# Patient Record
Sex: Male | Born: 1970 | Race: White | Hispanic: No | Marital: Married | Smoking: Current every day smoker
Health system: Southern US, Community
[De-identification: ages and names within clinical notes are randomized; demographics above are authoritative.]

## PROBLEM LIST (undated history)

## (undated) DIAGNOSIS — K219 Gastro-esophageal reflux disease without esophagitis: Secondary | ICD-10-CM

## (undated) HISTORY — DX: Gastro-esophageal reflux disease without esophagitis: K21.9

## (undated) HISTORY — PX: TONSILLECTOMY: SUR1361

---

## 1999-04-13 ENCOUNTER — Encounter: Payer: Self-pay | Admitting: *Deleted

## 1999-04-13 ENCOUNTER — Emergency Department (HOSPITAL_COMMUNITY): Admission: EM | Admit: 1999-04-13 | Discharge: 1999-04-13 | Payer: Self-pay | Admitting: Emergency Medicine

## 1999-04-25 ENCOUNTER — Emergency Department (HOSPITAL_COMMUNITY): Admission: EM | Admit: 1999-04-25 | Discharge: 1999-04-25 | Payer: Self-pay | Admitting: Emergency Medicine

## 1999-04-25 ENCOUNTER — Encounter: Payer: Self-pay | Admitting: Emergency Medicine

## 1999-12-03 ENCOUNTER — Emergency Department (HOSPITAL_COMMUNITY): Admission: EM | Admit: 1999-12-03 | Discharge: 1999-12-03 | Payer: Self-pay | Admitting: Emergency Medicine

## 2000-09-26 ENCOUNTER — Emergency Department (HOSPITAL_COMMUNITY): Admission: EM | Admit: 2000-09-26 | Discharge: 2000-09-27 | Payer: Self-pay | Admitting: Emergency Medicine

## 2000-11-09 ENCOUNTER — Other Ambulatory Visit: Admission: RE | Admit: 2000-11-09 | Discharge: 2000-11-09 | Payer: Self-pay | Admitting: Otolaryngology

## 2004-07-21 ENCOUNTER — Encounter: Admission: RE | Admit: 2004-07-21 | Discharge: 2004-07-21 | Payer: Self-pay | Admitting: Otolaryngology

## 2005-06-03 ENCOUNTER — Emergency Department (HOSPITAL_COMMUNITY): Admission: EM | Admit: 2005-06-03 | Discharge: 2005-06-03 | Payer: Self-pay | Admitting: Emergency Medicine

## 2013-04-29 ENCOUNTER — Encounter: Payer: Self-pay | Admitting: Family Medicine

## 2013-04-29 ENCOUNTER — Encounter (INDEPENDENT_AMBULATORY_CARE_PROVIDER_SITE_OTHER): Payer: Self-pay

## 2013-04-29 ENCOUNTER — Ambulatory Visit (INDEPENDENT_AMBULATORY_CARE_PROVIDER_SITE_OTHER): Payer: Managed Care, Other (non HMO)

## 2013-04-29 ENCOUNTER — Ambulatory Visit (INDEPENDENT_AMBULATORY_CARE_PROVIDER_SITE_OTHER): Payer: Managed Care, Other (non HMO) | Admitting: Family Medicine

## 2013-04-29 VITALS — BP 105/67 | HR 84 | Temp 99.0°F

## 2013-04-29 DIAGNOSIS — R509 Fever, unspecified: Secondary | ICD-10-CM

## 2013-04-29 DIAGNOSIS — R059 Cough, unspecified: Secondary | ICD-10-CM

## 2013-04-29 DIAGNOSIS — R05 Cough: Secondary | ICD-10-CM

## 2013-04-29 DIAGNOSIS — J329 Chronic sinusitis, unspecified: Secondary | ICD-10-CM

## 2013-04-29 DIAGNOSIS — R52 Pain, unspecified: Secondary | ICD-10-CM

## 2013-04-29 LAB — POCT INFLUENZA A/B
Influenza A, POC: NEGATIVE
Influenza B, POC: NEGATIVE

## 2013-04-29 MED ORDER — BENZONATATE 100 MG PO CAPS: ORAL_CAPSULE | ORAL | Status: DC

## 2013-04-29 MED ORDER — LEVOFLOXACIN 500 MG PO TABS: 500.0000 mg | ORAL_TABLET | Freq: Every day | ORAL | Status: DC

## 2013-04-29 MED ORDER — METHYLPREDNISOLONE (PAK) 4 MG PO TABS: ORAL_TABLET | ORAL | Status: DC

## 2013-04-29 NOTE — Progress Notes (Signed)
   Subjective:    Patient ID: Manuel Benitez, male    DOB: 08-21-1970, 43 y.o.   MRN: [id number]  HPI This 43 y.o. male presents for evaluation of cough, fever, sinus pressure, and fatigue.   Review of Systems C/o cough, fever, and fatigue. No chest pain, SOB, HA, dizziness, vision change, N/V, diarrhea, constipation, dysuria, urinary urgency or frequency, myalgias, arthralgias or rash.     Objective:   Physical Exam Vital signs noted  Well developed well nourished male.  HEENT - Head atraumatic Normocephalic                Eyes - PERRLA, Conjuctiva - clear Sclera- Clear EOMI                Ears - EAC's Wnl TM's Wnl Gross Hearing WNL                Nose - Nares patent                 Throat - oropharanx wnl Respiratory - Lungs CTA bilateral Cardiac - RRR S1 and S2 without murmur GI - Abdomen soft Nontender and bowel sounds active x 4 Extremities - No edema. Neuro - Grossly intact.  CXR - No infiltrates  Results for orders placed in visit on 04/29/13  POCT INFLUENZA A/B      Result Value Range   Influenza A, POC Negative     Influenza B, POC Negative        Assessment & Plan:  Sinus infection - Plan: levofloxacin (LEVAQUIN) 500 MG tablet, benzonatate (TESSALON PERLES) 100 MG capsule, methylPREDNIsolone (MEDROL DOSPACK) 4 MG tablet  Body aches - Plan: POCT Influenza A/B, benzonatate (TESSALON PERLES) 100 MG capsule  Fever - Plan: POCT Influenza A/B, DG Chest 2 View, levofloxacin (LEVAQUIN) 500 MG tablet  Cough - Plan: POCT Influenza A/B, DG Chest 2 View, benzonatate (TESSALON PERLES) 100 MG capsule  Deatra CanterWilliam J Elliot Meldrum FNP

## 2013-04-29 NOTE — Patient Instructions (Signed)

## 2014-02-03 ENCOUNTER — Ambulatory Visit (INDEPENDENT_AMBULATORY_CARE_PROVIDER_SITE_OTHER): Payer: Managed Care, Other (non HMO) | Admitting: Nurse Practitioner

## 2014-02-03 ENCOUNTER — Encounter: Payer: Self-pay | Admitting: Nurse Practitioner

## 2014-02-03 VITALS — BP 126/76 | HR 78 | Temp 96.7°F | Ht 72.0 in | Wt 218.0 lb

## 2014-02-03 DIAGNOSIS — J40 Bronchitis, not specified as acute or chronic: Secondary | ICD-10-CM

## 2014-02-03 DIAGNOSIS — J019 Acute sinusitis, unspecified: Secondary | ICD-10-CM

## 2014-02-03 MED ORDER — HYDROCODONE-HOMATROPINE 5-1.5 MG/5ML PO SYRP: 5.0000 mL | ORAL_SOLUTION | Freq: Three times a day (TID) | ORAL | Status: DC | PRN

## 2014-02-03 MED ORDER — METHYLPREDNISOLONE ACETATE 80 MG/ML IJ SUSP
80.0000 mg | Freq: Once | INTRAMUSCULAR | Status: AC
  Administered 2014-02-03: 80 mg via INTRAMUSCULAR

## 2014-02-03 MED ORDER — AZITHROMYCIN 250 MG PO TABS: ORAL_TABLET | ORAL | Status: DC

## 2014-02-03 NOTE — Progress Notes (Signed)
Subjective:    Patient ID: Manuel Benitez, male    DOB: 1970-05-08, 43 y.o.   MRN: [id number]  HPI Patient in today c/o cough and congestion  That started several days ago. No fever    Review of Systems  Constitutional: Negative for fever and chills.  HENT: Positive for congestion and sinus pressure.   Respiratory: Positive for cough.   Cardiovascular: Negative.   Gastrointestinal: Negative.   Genitourinary: Negative.   Neurological: Negative.   Psychiatric/Behavioral: Negative.   All other systems reviewed and are negative.      Objective:   Physical Exam  Constitutional: He is oriented to person, place, and time. He appears well-developed and well-nourished. He appears distressed.  HENT:  Right Ear: Hearing, tympanic membrane, external ear and ear canal normal.  Left Ear: Hearing, tympanic membrane, external ear and ear canal normal.  Nose: Mucosal edema and rhinorrhea present. Right sinus exhibits maxillary sinus tenderness and frontal sinus tenderness. Left sinus exhibits maxillary sinus tenderness and frontal sinus tenderness.  Mouth/Throat: Uvula is midline, oropharynx is clear and moist and mucous membranes are normal.  Eyes: Pupils are equal, round, and reactive to light.  Neck: Normal range of motion. Neck supple.  Cardiovascular: Normal rate, regular rhythm and normal heart sounds.   Pulmonary/Chest: Effort normal and breath sounds normal.  Abdominal: Soft. Bowel sounds are normal.  Lymphadenopathy:    He has no cervical adenopathy.  Neurological: He is alert and oriented to person, place, and time.  Skin: Skin is warm and dry.  Psychiatric: He has a normal mood and affect. His behavior is normal. Judgment and thought content normal.   BP 126/76  Pulse 78  Temp(Src) 96.7 F (35.9 C) (Oral)  Ht 6' (1.829 m)  Wt 218 lb (98.884 kg)  BMI 29.56 kg/m2        Assessment & Plan:   1. Acute rhinosinusitis   2. Bronchitis    Meds ordered this encounter    Medications  . omeprazole (PRILOSEC) 20 MG capsule    Sig: Take 20 mg by mouth daily.  Marland Kitchen. azithromycin (ZITHROMAX Z-PAK) 250 MG tablet    Sig: As directed    Dispense:  6 each    Refill:  0    Order Specific Question:  Supervising Provider    Answer:  Ernestina PennaMOORE, DONALD W [[id number]]  . HYDROcodone-homatropine (HYCODAN) 5-1.5 MG/5ML syrup    Sig: Take 5 mLs by mouth every 8 (eight) hours as needed for cough.    Dispense:  120 mL    Refill:  0    Order Specific Question:  Supervising Provider    Answer:  Ernestina PennaMOORE, DONALD W [[id number]]  . methylPREDNISolone acetate (DEPO-MEDROL) injection 80 mg    Sig:   SOP SMOKING!!!! 1. Take meds as prescribed 2. Use a cool mist humidifier especially during the winter months and when heat has been humid. 3. Use saline nose sprays frequently 4. Saline irrigations of the nose can be very helpful if done frequently.  * 4X daily for 1 week*  * Use of a nettie pot can be helpful with this. Follow directions with this* 5. Drink plenty of fluids 6. Keep thermostat turn down low 7.For any cough or congestion  Use plain Mucinex- regular strength or max strength is fine   * Children- consult with Pharmacist for dosing 8. For fever or aces or pains- take tylenol or ibuprofen appropriate for age and weight.  * for fevers greater than 101 orally you may  alternate ibuprofen and tylenol every  3 hours.   Mary-Margaret Hassell Done, FNP

## 2014-02-03 NOTE — Patient Instructions (Signed)

## 2014-02-06 ENCOUNTER — Telehealth: Payer: Self-pay | Admitting: Nurse Practitioner

## 2014-02-06 NOTE — Telephone Encounter (Signed)
Note printed and upfront for patient to pickup. Patient aware.

## 2014-02-06 NOTE — Telephone Encounter (Signed)
Ok to give note 

## 2015-12-03 ENCOUNTER — Ambulatory Visit (INDEPENDENT_AMBULATORY_CARE_PROVIDER_SITE_OTHER): Payer: Managed Care, Other (non HMO) | Admitting: Family

## 2015-12-03 ENCOUNTER — Encounter: Payer: Self-pay | Admitting: Family

## 2015-12-03 VITALS — BP 118/72 | HR 79 | Temp 97.2°F | Ht 72.0 in | Wt 213.0 lb

## 2015-12-03 DIAGNOSIS — L259 Unspecified contact dermatitis, unspecified cause: Secondary | ICD-10-CM | POA: Diagnosis not present

## 2015-12-03 DIAGNOSIS — B880 Other acariasis: Secondary | ICD-10-CM

## 2015-12-03 MED ORDER — TRIAMCINOLONE ACETONIDE 0.5 % EX OINT: 1.0000 "application " | TOPICAL_OINTMENT | Freq: Two times a day (BID) | CUTANEOUS | 0 refills | Status: DC

## 2015-12-03 MED ORDER — PREDNISONE 10 MG (21) PO TBPK: ORAL_TABLET | ORAL | 0 refills | Status: DC

## 2015-12-03 NOTE — Progress Notes (Signed)
   Subjective:    Patient ID: Manuel Benitez, male    DOB: [birth year], 45 y.o.   MRN: [id number]  Rash  This is a new problem. The current episode started in the past 7 days. The problem has been gradually worsening since onset. The affected locations include the torso, chest, left lower leg, left upper leg, left buttock, back, right lower leg and right upper leg. The rash is characterized by redness and itchiness. He was exposed to plant contact. Pertinent negatives include no congestion, cough, diarrhea, shortness of breath or sore throat. Past treatments include anti-itch cream. The treatment provided mild relief.   Rash developed after walking through woods for several hours.    Review of Systems  Constitutional: Negative.   HENT: Negative.  Negative for congestion and sore throat.   Respiratory: Negative.  Negative for cough and shortness of breath.   Cardiovascular: Negative.   Gastrointestinal: Negative.  Negative for diarrhea.  Endocrine: Negative.   Genitourinary: Negative.   Musculoskeletal: Negative.   Skin: Positive for rash.  Neurological: Negative.   Hematological: Negative.   Psychiatric/Behavioral: Negative.   All other systems reviewed and are negative.      Objective:   Physical Exam  Constitutional: He is oriented to person, place, and time. He appears well-developed and well-nourished. No distress.  HENT:  Head: Normocephalic.  Eyes: Pupils are equal, round, and reactive to light. Right eye exhibits no discharge. Left eye exhibits no discharge.  Neck: Normal range of motion. Neck supple. No thyromegaly present.  Cardiovascular: Normal rate, regular rhythm, normal heart sounds and intact distal pulses.   No murmur heard. Pulmonary/Chest: Effort normal and breath sounds normal. No respiratory distress. He has no wheezes.  Abdominal: Soft. Bowel sounds are normal. He exhibits no distension. There is no tenderness.  Musculoskeletal: Normal range of motion. He  exhibits no edema or tenderness.  Neurological: He is alert and oriented to person, place, and time. He has normal reflexes. No cranial nerve deficit.  Skin: Skin is warm and dry. Rash noted. No erythema.  Scattered papulars  on bilateral legs and lower back and lower abdomen   Psychiatric: He has a normal mood and affect. His behavior is normal. Judgment and thought content normal.  Vitals reviewed.     BP 118/72   Pulse 79   Temp 97.2 F (36.2 C) (Oral)   Ht 6' (1.829 m)   Wt 213 lb (96.6 kg)   BMI 28.89 kg/m      Assessment & Plan:  1. Contact dermatitis - triamcinolone ointment (KENALOG) 0.5 %; Apply 1 application topically 2 (two) times daily.  Dispense: 30 g; Refill: 0 - predniSONE (STERAPRED UNI-PAK 21 TAB) 10 MG (21) TBPK tablet; Use as directed  Dispense: 21 tablet; Refill: 0  2. Chigger bites  Do not scratch -Wear protective clothing while outside- Long sleeves and long pants -Put insect repellent on all exposed skin and along clothing -Take a shower as soon as possible after being outside If does not improve RTO   Jannifer Rodneyhristy Teighlor Korson, FNP    Jannifer Rodneyhristy Korbin Mapps, FNP

## 2015-12-03 NOTE — Patient Instructions (Signed)
Contact Dermatitis Dermatitis is redness, soreness, and swelling (inflammation) of the skin. Contact dermatitis is a reaction to certain substances that touch the skin. There are two types of contact dermatitis:   Irritant contact dermatitis. This type is caused by something that irritates your skin, such as dry hands from washing them too much. This type does not require previous exposure to the substance for a reaction to occur. This type is more common.  Allergic contact dermatitis. This type is caused by a substance that you are allergic to, such as a nickel allergy or poison ivy. This type only occurs if you have been exposed to the substance (allergen) before. Upon a repeat exposure, your body reacts to the substance. This type is less common. CAUSES  Many different substances can cause contact dermatitis. Irritant contact dermatitis is most commonly caused by exposure to:   Makeup.   Soaps.   Detergents.   Bleaches.   Acids.   Metal salts, such as nickel.  Allergic contact dermatitis is most commonly caused by exposure to:   Poisonous plants.   Chemicals.   Jewelry.   Latex.   Medicines.   Preservatives in products, such as clothing.  RISK FACTORS This condition is more likely to develop in:   People who have jobs that expose them to irritants or allergens.  People who have certain medical conditions, such as asthma or eczema.  SYMPTOMS  Symptoms of this condition may occur anywhere on your body where the irritant has touched you or is touched by you. Symptoms include:  Dryness or flaking.   Redness.   Cracks.   Itching.   Pain or a burning feeling.   Blisters.  Drainage of small amounts of blood or clear fluid from skin cracks. With allergic contact dermatitis, there may also be swelling in areas such as the eyelids, mouth, or genitals.  DIAGNOSIS  This condition is diagnosed with a medical history and physical exam. A patch skin test  may be performed to help determine the cause. If the condition is related to your job, you may need to see an occupational medicine specialist. TREATMENT Treatment for this condition includes figuring out what caused the reaction and protecting your skin from further contact. Treatment may also include:   Steroid creams or ointments. Oral steroid medicines may be needed in more severe cases.  Antibiotics or antibacterial ointments, if a skin infection is present.  Antihistamine lotion or an antihistamine taken by mouth to ease itching.  A bandage (dressing). HOME CARE INSTRUCTIONS Skin Care  Moisturize your skin as needed.   Apply cool compresses to the affected areas.  Try taking a bath with:  Epsom salts. Follow the instructions on the packaging. You can get these at your local pharmacy or grocery store.  Baking soda. Pour a small amount into the bath as directed by your health care provider.  Colloidal oatmeal. Follow the instructions on the packaging. You can get this at your local pharmacy or grocery store.  Try applying baking soda paste to your skin. Stir water into baking soda until it reaches a paste-like consistency.  Do not scratch your skin.  Bathe less frequently, such as every other day.  Bathe in lukewarm water. Avoid using hot water. Medicines  Take or apply over-the-counter and prescription medicines only as told by your health care provider.   If you were prescribed an antibiotic medicine, take or apply your antibiotic as told by your health care provider. Do not stop using the   antibiotic even if your condition starts to improve. General Instructions  Keep all follow-up visits as told by your health care provider. This is important.  Avoid the substance that caused your reaction. If you do not know what caused it, keep a journal to try to track what caused it. Write down:  What you eat.  What cosmetic products you use.  What you drink.  What  you wear in the affected area. This includes jewelry.  If you were given a dressing, take care of it as told by your health care provider. This includes when to change and remove it. SEEK MEDICAL CARE IF:   Your condition does not improve with treatment.  Your condition gets worse.  You have signs of infection such as swelling, tenderness, redness, soreness, or warmth in the affected area.  You have a fever.  You have new symptoms. SEEK IMMEDIATE MEDICAL CARE IF:   You have a severe headache, neck pain, or neck stiffness.  You vomit.  You feel very sleepy.  You notice red streaks coming from the affected area.  Your bone or joint underneath the affected area becomes painful after the skin has healed.  The affected area turns darker.  You have difficulty breathing.   This information is not intended to replace advice given to you by your health care provider. Make sure you discuss any questions you have with your health care provider.   Document Released: 04/07/2000 Document Revised: 12/30/2014 Document Reviewed: 08/26/2014 Elsevier Interactive Patient Education 2016 [employer]  

## 2016-03-02 ENCOUNTER — Ambulatory Visit (INDEPENDENT_AMBULATORY_CARE_PROVIDER_SITE_OTHER): Payer: Managed Care, Other (non HMO) | Admitting: Family Medicine

## 2016-03-02 ENCOUNTER — Encounter: Payer: Self-pay | Admitting: Family Medicine

## 2016-03-02 VITALS — BP 122/88 | HR 78 | Temp 97.0°F | Ht 72.0 in | Wt 214.2 lb

## 2016-03-02 DIAGNOSIS — N529 Male erectile dysfunction, unspecified: Secondary | ICD-10-CM

## 2016-03-02 DIAGNOSIS — J189 Pneumonia, unspecified organism: Secondary | ICD-10-CM

## 2016-03-02 DIAGNOSIS — J181 Lobar pneumonia, unspecified organism: Secondary | ICD-10-CM | POA: Diagnosis not present

## 2016-03-02 MED ORDER — TADALAFIL 20 MG PO TABS
10.0000 mg | ORAL_TABLET | ORAL | 11 refills | Status: DC | PRN
Start: 2016-03-02 — End: 2016-08-01

## 2016-03-02 MED ORDER — BENZONATATE 200 MG PO CAPS: 200.0000 mg | ORAL_CAPSULE | Freq: Two times a day (BID) | ORAL | 0 refills | Status: DC | PRN

## 2016-03-02 MED ORDER — AMOXICILLIN-POT CLAVULANATE 875-125 MG PO TABS: 1.0000 | ORAL_TABLET | Freq: Two times a day (BID) | ORAL | 0 refills | Status: DC

## 2016-03-02 NOTE — Progress Notes (Signed)
   HPI  Patient presents today here with hoarseness, cough, and sore throat.  Patient states they've been sick now for about 6 days. He states that hestarted throat irritation, he continued having worsening irritation and began to develop malaise and generalized weakness with chills.  He has had several temperatures measured at around 100.0 and 1 as high as 100.9. He states that over the last 4 days his cough has become more persistent, productive of thick white sputum. He has also had some mild dyspnea. He does have some mild chest pain with deep inspiration.  He's a smoker, understands that he needs to quit. He understands that this could be related to his smoking.  ED For several months, mainly issues maintaining erection.  Some premature ejaculation, normal sex drive  PMH: Smoking status noted ROS: Per HPI  Objective: BP 122/88   Pulse 78   Temp 97 F (36.1 C) (Oral)   Ht 6' (1.829 m)   Wt 214 lb 3.2 oz (97.2 kg)   BMI 29.05 kg/m  Gen: NAD, alert, cooperative with exam HEENT: NCAT, oropharynx clear, nares clear CV: RRR, good S1/S2, no murmur Resp: Nonlabored, left lower lung field with persistent expiratory wheezes and coarse breath sounds Ext: No edema, warm Neuro: Alert and oriented, No gross deficits  Assessment and plan:  # Community-acquired pneumonia Treat with Augmentin and Tessalon Perles Discussed supportive care Return to clinic with any concerns or worsening symptoms.  # erectile dysfunction New problem Trial of Cialis Ok with revatio if too expensive       Meds ordered this encounter  Medications  . amoxicillin-clavulanate (AUGMENTIN) 875-125 MG tablet    Sig: Take 1 tablet by mouth 2 (two) times daily.    Dispense:  20 tablet    Refill:  0  . tadalafil (CIALIS) 20 MG tablet    Sig: Take 0.5-1 tablets (10-20 mg total) by mouth every other day as needed for erectile dysfunction.    Dispense:  5 tablet    Refill:  11  . benzonatate  (TESSALON) 200 MG capsule    Sig: Take 1 capsule (200 mg total) by mouth 2 (two) times daily as needed for cough.    Dispense:  20 capsule    Refill:  0    Murtis SinkSam Cleatis Fandrich, MD [facility] 03/02/2016, 6:56 PM

## 2016-03-02 NOTE — Patient Instructions (Signed)
Great to see you!   Community-Acquired Pneumonia, Adult Pneumonia is an infection of the lungs. There are different types of pneumonia. One type can develop while a person is in a hospital. A different type, called community-acquired pneumonia, develops in people who are not, or have not recently been, in the hospital or other health care facility.  CAUSES Pneumonia may be caused by bacteria, viruses, or funguses. Community-acquired pneumonia is often caused by Streptococcus pneumonia bacteria. These bacteria are often passed from one person to another by breathing in droplets from the cough or sneeze of an infected person. RISK FACTORS The condition is more likely to develop in:  People who havechronic diseases, such as chronic obstructive pulmonary disease (COPD), asthma, congestive heart failure, cystic fibrosis, diabetes, or kidney disease.  People who haveearly-stage or late-stage HIV.  People who havesickle cell disease.  People who havehad their spleen removed (splenectomy).  People who havepoor dental hygiene.  People who havemedical conditions that increase the risk of breathing in (aspirating) secretions their own mouth and nose.   People who havea weakened immune system (immunocompromised).  People who smoke.  People whotravel to areas where pneumonia-causing germs commonly exist.  People whoare around animal habitats or animals that have pneumonia-causing germs, including birds, bats, rabbits, cats, and farm animals. SYMPTOMS Symptoms of this condition include:  Adry cough.  A wet (productive) cough.  Fever.  Sweating.  Chest pain, especially when breathing deeply or coughing.  Rapid breathing or difficulty breathing.  Shortness of breath.  Shaking chills.  Fatigue.  Muscle aches. DIAGNOSIS Your health care provider will take a medical history and perform a physical exam. You may also have other tests, including:  Imaging studies of your  chest, including X-rays.  Tests to check your blood oxygen level and other blood gases.  Other tests on blood, mucus (sputum), fluid around your lungs (pleural fluid), and urine. If your pneumonia is severe, other tests may be done to identify the specific cause of your illness. TREATMENT The type of treatment that you receive depends on many factors, such as the cause of your pneumonia, the medicines you take, and other medical conditions that you have. For most adults, treatment and recovery from pneumonia may occur at home. In some cases, treatment must happen in a hospital. Treatment may include:  Antibiotic medicines, if the pneumonia was caused by bacteria.  Antiviral medicines, if the pneumonia was caused by a virus.  Medicines that are given by mouth or through an IV tube.  Oxygen.  Respiratory therapy. Although rare, treating severe pneumonia may include:  Mechanical ventilation. This is done if you are not breathing well on your own and you cannot maintain a safe blood oxygen level.  Thoracentesis. This procedureremoves fluid around one lung or both lungs to help you breathe better. HOME CARE INSTRUCTIONS  Take over-the-counter and prescription medicines only as told by your health care provider.  Only takecough medicine if you are losing sleep. Understand that cough medicine can prevent your body's natural ability to remove mucus from your lungs.  If you were prescribed an antibiotic medicine, take it as told by your health care provider. Do not stop taking the antibiotic even if you start to feel better.  Sleep in a semi-upright position at night. Try sleeping in a reclining chair, or place a few pillows under your head.  Do not use tobacco products, including cigarettes, chewing tobacco, and e-cigarettes. If you need help quitting, ask your health care provider.  Drink   enough water to keep your urine clear or pale yellow. This will help to thin out mucus secretions  in your lungs. PREVENTION There are ways that you can decrease your risk of developing community-acquired pneumonia. Consider getting a pneumococcal vaccine if:  You are older than 46 years of age.  You are older than 46 years of age and are undergoing cancer treatment, have chronic lung disease, or have other medical conditions that affect your immune system. Ask your health care provider if this applies to you. There are different types and schedules of pneumococcal vaccines. Ask your health care provider which vaccination option is best for you. You may also prevent community-acquired pneumonia if you take these actions:  Get an influenza vaccine every year. Ask your health care provider which type of influenza vaccine is best for you.  Go to the dentist on a regular basis.  Wash your hands often. Use hand sanitizer if soap and water are not available. SEEK MEDICAL CARE IF:  You have a fever.  You are losing sleep because you cannot control your cough with cough medicine. SEEK IMMEDIATE MEDICAL CARE IF:  You have worsening shortness of breath.  You have increased chest pain.  Your sickness becomes worse, especially if you are an older adult or have a weakened immune system.  You cough up blood.   This information is not intended to replace advice given to you by your health care provider. Make sure you discuss any questions you have with your health care provider.   Document Released: 04/10/2005 Document Revised: 12/30/2014 Document Reviewed: 08/05/2014 Elsevier Interactive Patient Education 2016 [employer]  

## 2016-08-01 ENCOUNTER — Ambulatory Visit (INDEPENDENT_AMBULATORY_CARE_PROVIDER_SITE_OTHER): Payer: Managed Care, Other (non HMO) | Admitting: Family

## 2016-08-01 ENCOUNTER — Encounter: Payer: Self-pay | Admitting: Family

## 2016-08-01 VITALS — BP 131/85 | HR 87 | Temp 97.1°F | Ht 72.0 in | Wt 207.0 lb

## 2016-08-01 DIAGNOSIS — F17200 Nicotine dependence, unspecified, uncomplicated: Secondary | ICD-10-CM

## 2016-08-01 DIAGNOSIS — N529 Male erectile dysfunction, unspecified: Secondary | ICD-10-CM | POA: Diagnosis not present

## 2016-08-01 DIAGNOSIS — L255 Unspecified contact dermatitis due to plants, except food: Secondary | ICD-10-CM | POA: Diagnosis not present

## 2016-08-01 MED ORDER — PREDNISONE 10 MG (21) PO TBPK: ORAL_TABLET | ORAL | 0 refills | Status: DC

## 2016-08-01 MED ORDER — SILDENAFIL CITRATE 20 MG PO TABS: ORAL_TABLET | ORAL | 2 refills | Status: AC

## 2016-08-01 NOTE — Patient Instructions (Signed)
Poison Oak Dermatitis Poison oak dermatitis is inflammation of the skin that is caused by contact with the allergens on the leaves of the poison oak (toxicodendron) plant. The skin reaction often includes redness, swelling, blisters, and extreme itching. What are the causes? This condition is caused by a specific chemical (urushiol) that is found in the sap of the poison oak plant. This chemical is sticky and it can be easily spread to people, animals, and objects. You can get poison oak dermatitis by:  Having direct contact with a poison oak plant.  Touching animals, other people, or objects that have come in contact with poison oak and have the chemical on them. What increases the risk? This condition is more likely to develop in people who:  Are outdoors often.  Go outdoors without wearing protective clothing, such as closed shoes, long pants, and a long-sleeved shirt. What are the signs or symptoms? Symptoms of this condition include:  Redness of the skin.  A rash that may develop blisters.  Extreme itching.  Swelling. This may occur if the reaction is more severe. Symptoms usually last for 1-2 weeks. However, the first time you develop this condition, symptoms may last 3-4 weeks. How is this diagnosed? This condition may be diagnosed based on your symptoms and a physical exam. Your health care provider may also ask you about any recent outdoor activity. How is this treated? Treatment for this condition will vary depending on how severe it is. Treatment may include:  Hydrocortisone creams or calamine lotions to relieve itching.  Oatmeal baths to soothe the skin.  Over-the-counter antihistamine tablets.  Oral steroid medicine for more severe outbreaks. Follow these instructions at home:  Take or apply over-the-counter and prescription medicines only as told by your health care provider.  Wash exposed skin as soon as possible with soap and cold water.  Use hydrocortisone  creams or calamine lotion as needed to soothe the skin and relieve itching.  Take oatmeal baths as needed. Use colloidal oatmeal. You can get this at your local pharmacy or grocery store. Follow the instructions on the packaging.  Do not scratch or rub your skin.  While you have the rash, wash clothes right after you wear them. How is this prevented?  Learn to identify the poison oak plant and avoid contact with the plant. This plant can be recognized by the number of leaves. Generally, poison oak has three leaves with flowering branches on a single stem. The leaves are often a bit fuzzy and have a toothlike edge.  If you have been exposed to poison oak, thoroughly wash with soap and water right away. You have about 30 minutes to remove the plant resin before it will cause the rash. Be sure to wash under your fingernails because any plant resin there will continue to spread the rash.  When hiking or camping, wear clothes that will help you avoid exposure on the skin. This includes long pants, a long-sleeved shirt, tall socks, and hiking boots. You can also apply preventive lotion to your skin to help limit exposure.  If you suspect that your clothes or outdoor gear came in contact with poison oak, rinse them off outside with a garden hose before bringing them inside your house. Contact a health care provider if:  You have open sores in the rash area.  You have more redness, swelling, or pain in the affected area.  You have redness that spreads beyond the rash area.  You have fluid, blood, or pus   coming from the affected area.  You have a fever.  You have a rash over a large area of your body.  You have a rash on your eyes, mouth, or genitals.  Your rash does not improve after a few days. Get help right away if:  Your face swells or your eyes swell shut.  You have trouble breathing.  You have trouble swallowing. This information is not intended to replace advice given to you by  your health care provider. Make sure you discuss any questions you have with your health care provider. Document Released: 10/15/2002 Document Revised: 09/16/2015 Document Reviewed: 09/16/2014 Elsevier Interactive Patient Education  2017 Elsevier Inc.  

## 2016-08-01 NOTE — Progress Notes (Signed)
   Subjective:    Patient ID: Manuel Benitez, male    DOB: 1971/04/13, 46 y.o.   MRN: [id number]  Rash  The current episode started in the past 7 days. The problem is unchanged. The affected locations include the back, groin, abdomen, right arm, left lower leg, left upper leg, right upper leg and right lower leg. The rash is characterized by itchiness and redness. He was exposed to plant contact. Pertinent negatives include no congestion, diarrhea, joint pain or shortness of breath. Past treatments include cold compress, anti-itch cream and topical steroids. The treatment provided mild relief.  ED PT complaining of ED symptoms. States he tried Cialis with little relief and the cost of his prescription was $45 for 5 pills. Pt requesting to try another medication    Review of Systems  HENT: Negative for congestion.   Respiratory: Negative for shortness of breath.   Gastrointestinal: Negative for diarrhea.  Musculoskeletal: Negative for joint pain.  Skin: Positive for rash.  All other systems reviewed and are negative.      Objective:   Physical Exam  Constitutional: He is oriented to person, place, and time. He appears well-developed and well-nourished. No distress.  HENT:  Head: Normocephalic.  Right Ear: External ear normal.  Left Ear: External ear normal.  Mouth/Throat: Oropharynx is clear and moist.  Eyes: Pupils are equal, round, and reactive to light. Right eye exhibits no discharge. Left eye exhibits no discharge.  Neck: Normal range of motion. Neck supple. No thyromegaly present.  Cardiovascular: Normal rate, regular rhythm, normal heart sounds and intact distal pulses.   No murmur heard. Pulmonary/Chest: Effort normal and breath sounds normal. No respiratory distress. He has no wheezes.  Abdominal: Soft. Bowel sounds are normal. He exhibits no distension. There is no tenderness.  Musculoskeletal: Normal range of motion. He exhibits no edema or tenderness.  Neurological: He  is alert and oriented to person, place, and time. He has normal reflexes. No cranial nerve deficit.  Skin: Skin is warm and dry. Rash noted. No erythema.  Generalized erythemas in left upper arm, abdomen, bilateral upper legs   Psychiatric: He has a normal mood and affect. His behavior is normal. Judgment and thought content normal.  Vitals reviewed.   BP 131/85   Pulse 87   Temp 97.1 F (36.2 C) (Oral)   Ht 6' (1.829 m)   Wt 207 lb (93.9 kg)   BMI 28.07 kg/m       Assessment & Plan:  1. Contact dermatitis due to plant -Do not scratch -Wear protective clothing while outside- Long sleeves and long pants -Take a shower as soon as possible after being outside RTO prn - predniSONE (STERAPRED UNI-PAK 21 TAB) 10 MG (21) TBPK tablet; Use as directed  Dispense: 21 tablet; Refill: 0  2. Smoker, current status unknown Smoking cessation   3. Erectile dysfunction, unspecified erectile dysfunction type -Will try sildenafil  - sildenafil (REVATIO) 20 MG tablet; Take 2-5 tabs as needed for sexual activity  Dispense: 50 tablet; Refill: 2   Jannifer Rodney, FNP

## 2016-12-05 ENCOUNTER — Emergency Department (HOSPITAL_COMMUNITY)
Admission: EM | Admit: 2016-12-05 | Discharge: 2016-12-06 | Disposition: A | Discharge status: Home / Self Care | Payer: Worker's Compensation | Attending: Emergency Medicine | Admitting: Emergency Medicine

## 2016-12-05 ENCOUNTER — Encounter (HOSPITAL_COMMUNITY): Payer: Self-pay | Admitting: Emergency Medicine

## 2016-12-05 DIAGNOSIS — F17210 Nicotine dependence, cigarettes, uncomplicated: Secondary | ICD-10-CM | POA: Insufficient documentation

## 2016-12-05 DIAGNOSIS — S39012A Strain of muscle, fascia and tendon of lower back, initial encounter: Secondary | ICD-10-CM | POA: Diagnosis not present

## 2016-12-05 DIAGNOSIS — W1789XA Other fall from one level to another, initial encounter: Secondary | ICD-10-CM | POA: Insufficient documentation

## 2016-12-05 DIAGNOSIS — Y929 Unspecified place or not applicable: Secondary | ICD-10-CM | POA: Diagnosis not present

## 2016-12-05 DIAGNOSIS — M542 Cervicalgia: Secondary | ICD-10-CM | POA: Diagnosis not present

## 2016-12-05 DIAGNOSIS — S060X1A Concussion with loss of consciousness of 30 minutes or less, initial encounter: Secondary | ICD-10-CM | POA: Insufficient documentation

## 2016-12-05 DIAGNOSIS — Y990 Civilian activity done for income or pay: Secondary | ICD-10-CM | POA: Insufficient documentation

## 2016-12-05 DIAGNOSIS — M545 Low back pain: Secondary | ICD-10-CM | POA: Diagnosis present

## 2016-12-05 DIAGNOSIS — Y939 Activity, unspecified: Secondary | ICD-10-CM | POA: Diagnosis not present

## 2016-12-05 DIAGNOSIS — W19XXXA Unspecified fall, initial encounter: Secondary | ICD-10-CM

## 2016-12-05 MED ORDER — IBUPROFEN 800 MG PO TABS
800.0000 mg | ORAL_TABLET | Freq: Once | ORAL | Status: AC
  Administered 2016-12-06: 800 mg via ORAL
  Filled 2016-12-05: qty 1

## 2016-12-05 NOTE — ED Triage Notes (Signed)
P:t to ED with c/o falling backwards off of a truck.  St's he landed on his back.  Pt c/o pain in lower back  Also tenderness in neck.  Pt st's accident occurred approx 6 hours ago.

## 2016-12-05 NOTE — ED Provider Notes (Signed)
[facility] DEPT Provider Note   CSN: [id number] Arrival date & time: 12/05/16  2000     History   Chief Complaint Chief Complaint  Patient presents with  . Fall  . Back Pain  . Neck Pain    HPI Manuel Benitez is a 46 y.o. male.  The history is provided by the patient.  Fall  This is a new problem. The current episode started 6 to 12 hours ago. The problem occurs constantly. The problem has not changed since onset.Associated symptoms include headaches. Pertinent negatives include no chest pain, no abdominal pain and no shortness of breath. The symptoms are aggravated by walking. The symptoms are relieved by rest.  Back Pain   Associated symptoms include headaches. Pertinent negatives include no chest pain, no fever and no abdominal pain.  Neck Pain   Associated symptoms include headaches. Pertinent negatives include no chest pain.   Pt reports he fell from truck He was at work when he fell from truck accidentally He landed directly on his back He did hit the back of his head He reports brief LOC He now has mild HA, back pain and neck pain He has abrasions to elbow No other acute complaints Past Medical History:  Diagnosis Date  . GERD (gastroesophageal reflux disease)     Patient Active Problem List   Diagnosis Date Noted  . Smoker, current status unknown 08/01/2016    Past Surgical History:  Procedure Laterality Date  . TONSILLECTOMY         Home Medications    Prior to Admission medications   Medication Sig Start Date End Date Taking? Authorizing Provider  predniSONE (STERAPRED UNI-PAK 21 TAB) 10 MG (21) TBPK tablet Use as directed 08/01/16   Evelina Dun A, FNP  sildenafil (REVATIO) 20 MG tablet Take 2-5 tabs as needed for sexual activity 08/01/16   Sharion Balloon, FNP    Family History Family History  Problem Relation Age of Onset  . Hemachromatosis Mother     Social History Social History  Substance Use Topics  . Smoking status:  Current Every Day Smoker    Packs/day: 1.00    Types: Cigarettes  . Smokeless tobacco: Never Used  . Alcohol use Yes     Allergies   Patient has no known allergies.   Review of Systems Review of Systems  Constitutional: Negative for fever.  Respiratory: Negative for shortness of breath.   Cardiovascular: Negative for chest pain.  Gastrointestinal: Negative for abdominal pain and vomiting.  Musculoskeletal: Positive for back pain and neck pain.  Skin: Positive for wound.  Neurological: Positive for headaches.  All other systems reviewed and are negative.    Physical Exam Updated Vital Signs BP 131/77 (BP Location: Right Arm)   Pulse 93   Temp 98.1 F (36.7 C) (Oral)   Resp 18   Ht 1.791 m (5' 10.5")   Wt 94.8 kg (209 lb)   SpO2 96%   BMI 29.56 kg/m   Physical Exam CONSTITUTIONAL: Well developed/well nourished HEAD: Normocephalic/atraumatic, mild tenderness to posterior scalp EYES: EOMI/PERRL ENMT: Mucous membranes moist NECK: supple no meningeal signs SPINE/BACK:no cervical/thoracic tenderness, lumbar tenderness noted, No bruising/crepitance/stepoffs noted to spine NEXUS criteria met.   CV: S1/S2 noted, no murmurs/rubs/gallops noted LUNGS: Lungs are clear to auscultation bilaterally, no apparent distress Chest - nontender ABDOMEN: soft, nontender, no rebound or guarding, bowel sounds noted throughout abdomen GU:no cva tenderness NEURO: Pt is awake/alert/appropriate, moves all extremitiesx4.  No facial droop.  No focal  weakness noted in lower extremities EXTREMITIES: pulses normal/equal, full ROM, All extremities/joints palpated/ranged and nontender SKIN: warm, color normal, abrasions to each elbow PSYCH: no abnormalities of mood noted, alert and oriented to situation   ED Treatments / Results  Labs (all labs ordered are listed, but only abnormal results are displayed) Labs Reviewed - No data to display  EKG  EKG Interpretation None        Radiology Dg Lumbar Spine Complete  Result Date: 12/06/2016 CLINICAL DATA:  [address] 6 feet out of truck, landing on back today. Severe lower back pain. EXAM: LUMBAR SPINE - COMPLETE 4+ VIEW COMPARISON:  None. FINDINGS: Five non rib-bearing lumbar-type vertebral bodies are intact and aligned with maintenance of the lumbar lordosis. Intervertebral mild L2-3 disc height loss with endplate spurring. Mild endplate spurring N8-2, L4-5. No destructive bony lesions. Sacroiliac joints are symmetric. Included prevertebral and paraspinal soft tissue planes are non-suspicious. Subcentimeter calcification projecting LEFT flank. IMPRESSION: Mild degenerative changes lumbar spine. No acute fracture deformity or malalignment. Electronically Signed   By: Elon Alas M.D.   On: 12/06/2016 00:19    Procedures Procedures (including critical care time)  Medications Ordered in ED Medications  ibuprofen (ADVIL,MOTRIN) tablet 800 mg (800 mg Oral Given 12/06/16 0016)     Initial Impression / Assessment and Plan / ED Course  I have reviewed the triage vital signs and the nursing notes.  Pertinent imaging results that were available during my care of the patient were reviewed by me and considered in my medical decision making (see chart for details).     Imaging negative Pt stable No neuro deficits Referred to PCP We discussed strict ER return precautions  Final Clinical Impressions(s) / ED Diagnoses   Final diagnoses:  Fall, initial encounter  Concussion with loss of consciousness of 30 minutes or less, initial encounter  Strain of lumbar region, initial encounter    New Prescriptions New Prescriptions   CYCLOBENZAPRINE (FLEXERIL) 10 MG TABLET    Take 1 tablet (10 mg total) by mouth 2 (two) times daily as needed for muscle spasms.   IBUPROFEN (ADVIL,MOTRIN) 600 MG TABLET    Take 1 tablet (600 mg total) by mouth every 8 (eight) hours as needed.     Ripley Fraise, MD 12/06/16 [phone]

## 2016-12-05 NOTE — ED Notes (Signed)
To xray at this time.

## 2016-12-05 NOTE — ED Notes (Signed)
Writer called for reassess of VS, no response.

## 2016-12-06 ENCOUNTER — Emergency Department (HOSPITAL_COMMUNITY): Payer: Worker's Compensation

## 2016-12-06 MED ORDER — CYCLOBENZAPRINE HCL 10 MG PO TABS
10.0000 mg | ORAL_TABLET | Freq: Two times a day (BID) | ORAL | 0 refills | Status: DC | PRN
Start: 2016-12-06 — End: 2017-05-26

## 2016-12-06 MED ORDER — IBUPROFEN 600 MG PO TABS: 600.0000 mg | ORAL_TABLET | Freq: Three times a day (TID) | ORAL | 0 refills | Status: AC | PRN

## 2016-12-06 NOTE — Discharge Instructions (Signed)

## 2016-12-06 NOTE — ED Notes (Signed)
ED Provider at bedside. 

## 2016-12-08 ENCOUNTER — Ambulatory Visit (INDEPENDENT_AMBULATORY_CARE_PROVIDER_SITE_OTHER): Payer: 59 | Admitting: Nurse Practitioner

## 2016-12-08 ENCOUNTER — Encounter: Payer: Self-pay | Admitting: Nurse Practitioner

## 2016-12-08 VITALS — BP 126/74 | HR 79 | Temp 96.0°F | Ht 70.0 in | Wt 216.0 lb

## 2016-12-08 DIAGNOSIS — S3992XA Unspecified injury of lower back, initial encounter: Secondary | ICD-10-CM

## 2016-12-08 DIAGNOSIS — S3992XD Unspecified injury of lower back, subsequent encounter: Secondary | ICD-10-CM

## 2016-12-08 NOTE — Progress Notes (Signed)
   Subjective:    Patient ID: Manuel Benitez, male    DOB: 1970/05/07, 46 y.o.   MRN: [id number]  HPI Patient fell out of the door of a transfer truck. He said his shoe caught caught casing him to fall backwards.he landed on his back. He went to ER at [place]. Back xrays were negative, just showed degenerative changes. He was given flexeril and ibuprofen. It is helping with pain some.sore on area where he hit but otherwise slight pain when lying down. Rates 3/10 can go up to 7/10 with lots of activity.    Review of Systems  Constitutional: Negative.   Respiratory: Negative.   Cardiovascular: Negative.   Musculoskeletal: Positive for back pain. Negative for gait problem.  Neurological: Negative.   Psychiatric/Behavioral: Negative.   All other systems reviewed and are negative.      Objective:   Physical Exam  Constitutional: He is oriented to person, place, and time. He appears well-developed and well-nourished. No distress.  Cardiovascular: Normal rate and regular rhythm.   Pulmonary/Chest: Effort normal and breath sounds normal.  Musculoskeletal:  FROM of back with pullling sensation on rotation (-) SLR bil Motor strength and sensation distally intact   Neurological: He is alert and oriented to person, place, and time. He has normal reflexes.  Skin: Skin is warm.  echymosis mid lower back  Psychiatric: He has a normal mood and affect. His behavior is normal. Judgment and thought content normal.   BP 126/74   Pulse 79   Temp (!) 96 F (35.6 C) (Oral)   Ht 5\' 10"  (1.778 m)   Wt 216 lb (98 kg)   BMI 30.99 kg/m      Assessment & Plan:   1. Lower back injury, subsequent encounter    Continue motrin and flexeril as rx Moist heat to lower back 'no heavy lifting for next 3 days RTO prn  Mary-Margaret Daphine Deutscher, FNP

## 2016-12-08 NOTE — Patient Instructions (Signed)
Low Back Sprain  A sprain is a stretch or tear in the bands of tissue that hold bones and joints together (ligaments). Sprains of the lower back (lumbar spine) are a common cause of low back pain. A sprain occurs when ligaments are overextended or stretched beyond their limits. The ligaments can become inflamed, resulting in pain and sudden muscle tightening (spasms). A sprain can be caused by an injury (trauma), or it can develop gradually due to overuse.  There are three types of sprains:  · Grade 1 is a mild sprain involving an overstretched ligament or a very slight tear of the ligament.  · Grade 2 is a moderate sprain involving a partial tear of the ligament.  · Grade 3 is a severe sprain involving a complete tear of the ligament.    What are the causes?  This condition may be caused by:  · Trauma, such as a fall or a hit to the body.  · Twisting or overstretching the back. This may result from doing activities that require a lot of energy, such as lifting heavy objects.    What increases the risk?  The following factors may increase your risk of getting this condition:  · Playing contact sports.  · Participating in sports or activities that put excessive stress on the back and require a lot of bending and twisting, including:  ? Lifting weights or heavy objects.  ? Gymnastics.  ? Soccer.  ? Figure skating.  ? Snowboarding.  · Being overweight or obese.  · Having poor strength and flexibility.    What are the signs or symptoms?  Symptoms of this condition may include:  · Sharp or dull pain in the lower back that does not go away. Pain may extend to the buttocks.  · Stiffness.  · Limited range of motion.  · Inability to stand up straight due to stiffness or pain.  · Muscle spasms.    How is this diagnosed?    This condition may be diagnosed based on:  · Your symptoms.  · Your medical history.  · A physical exam.  ? Your health care provider may push on certain areas of your back to determine the source of your  pain.  ? You may be asked to bend forward, backward, and side to side to assess the severity of your pain and your range of motion.  · Imaging tests, such as:  ? X-rays.  ? MRI.    How is this treated?  Treatment for this condition may include:  · Applying heat and cold to the affected area.  · Medicines to help relieve pain and to relax your muscles (muscle relaxants).  · NSAIDs to help reduce swelling and discomfort.  · Physical therapy.    When your symptoms improve, it is important to gradually return to your normal routine as soon as possible to reduce pain, avoid stiffness, and avoid loss of muscle strength. Generally, symptoms should improve within 6 weeks of treatment. However, recovery time varies.  Follow these instructions at home:  Managing pain, stiffness, and swelling  · If directed, apply ice to the injured area during the first 24 hours after your injury.  ? Put ice in a plastic bag.  ? Place a towel between your skin and the bag.  ? Leave the ice on for 20 minutes, 2-3 times a day.  · If directed, apply heat to the affected area as often as told by your health care provider. Use the   heat source that your health care provider recommends, such as a moist heat pack or a heating pad.  ? Place a towel between your skin and the heat source.  ? Leave the heat on for 20-30 minutes.  ? Remove the heat if your skin turns bright red. This is especially important if you are unable to feel pain, heat, or cold. You may have a greater risk of getting burned.  Activity  · Rest and return to your normal activities as told by your health care provider. Ask your health care provider what activities are safe for you.  · Avoid activities that take a lot of effort (are strenuous) for as long as told by your health care provider.  · Do exercises as told by your health care provider.  General instructions    · Take over-the-counter and prescription medicines only as told by your health care provider.  · If you have  questions or concerns about safety while taking pain medicine, talk with your health care provider.  · Do not drive or operate heavy machinery until you know how your pain medicine affects you.  · Do not use any tobacco products, such as cigarettes, chewing tobacco, and e-cigarettes. Tobacco can delay bone healing. If you need help quitting, ask your health care provider.  · Keep all follow-up visits as told by your health care provider. This is important.  How is this prevented?  · Warm up and stretch before being active.  · Cool down and stretch after being active.  · Give your body time to rest between periods of activity.  · Avoid:  ? Being physically inactive for long periods at a time.  ? Exercising or playing sports when you are tired or in pain.  · Use correct form when playing sports and lifting heavy objects.  · Use good posture when sitting and standing.  · Maintain a healthy weight.  · Sleep on a mattress with medium firmness to support your back.  · Make sure to use equipment that fits you, including shoes that fit well.  · Be safe and responsible while being active to avoid falls.  · Do at least 150 minutes of moderate-intensity exercise each week, such as brisk walking or water aerobics. Try a form of exercise that takes stress off your back, such as swimming or stationary cycling.  · Maintain physical fitness, including:  ? Strength. In particular, develop and maintain strong abdominal muscles.  ? Flexibility.  ? Cardiovascular fitness.  ? Endurance.  Contact a health care provider if:  · Your back pain does not improve after 6 weeks of treatment.  · Your symptoms get worse.  Get help right away if:  · Your back pain is severe.  · You are unable to stand or walk.  · You develop pain in your legs.  · You develop weakness in your buttocks or legs.  · You have difficulty controlling when you urinate or when you have a bowel movement.  This information is not intended to replace advice given to you by  your health care provider. Make sure you discuss any questions you have with your health care provider.  Document Released: 04/10/2005 Document Revised: 12/16/2015 Document Reviewed: 01/20/2015  Elsevier Interactive Patient Education © 2018 [employer] Inc.

## 2017-05-26 ENCOUNTER — Ambulatory Visit (INDEPENDENT_AMBULATORY_CARE_PROVIDER_SITE_OTHER): Payer: 59 | Admitting: Family Medicine

## 2017-05-26 VITALS — BP 129/79 | HR 84 | Temp 97.1°F | Ht 70.0 in | Wt 220.0 lb

## 2017-05-26 DIAGNOSIS — J0100 Acute maxillary sinusitis, unspecified: Secondary | ICD-10-CM

## 2017-05-26 MED ORDER — AMOXICILLIN-POT CLAVULANATE 875-125 MG PO TABS: 1.0000 | ORAL_TABLET | Freq: Two times a day (BID) | ORAL | 0 refills | Status: AC

## 2017-05-26 MED ORDER — METHYLPREDNISOLONE ACETATE 80 MG/ML IJ SUSP
80.0000 mg | Freq: Once | INTRAMUSCULAR | Status: AC
  Administered 2017-05-26: 80 mg via INTRAMUSCULAR

## 2017-05-26 MED ORDER — ALBUTEROL SULFATE HFA 108 (90 BASE) MCG/ACT IN AERS: 2.0000 | INHALATION_SPRAY | Freq: Four times a day (QID) | RESPIRATORY_TRACT | 0 refills | Status: DC | PRN

## 2017-05-26 NOTE — Progress Notes (Signed)
   HPI  Patient presents today here for illness.  Patient seen in Saturday clinic.  Patient reports 13 days of subjective fever, chest congestion cough, productive of phlegm. Now with sinus pain and pressure.  Patient also explains that he has had coughing fits, 1 of them on this made him pass out.  He is used albuterol at home with good improvement.  He is a smoker and considering to quit. He is tolerating food and fluids like usual  PMH: Smoking status noted ROS: Per HPI  Objective: BP 129/79   Pulse 84   Temp (!) 97.1 F (36.2 C) (Oral)   Ht 5\' 10"  (1.778 m)   Wt 220 lb (99.8 kg)   BMI 31.57 kg/m  Gen: NAD, alert, cooperative with exam HEENT: NCAT, oropharynx moist and clear, tenderness to palpation of bilateral maxillary sinuses, CV: RRR, good S1/S2, no murmur Resp: Wheezing, good air movement, Ext: No edema, warm Neuro: Alert and oriented, No gross deficits  Assessment and plan:  #Acute maxillary sinusitis With bronchitis, possible underlying developing COPD Given IM Depo-Medrol, Augmentin, and albuterol. Discussed smoking cessation, he is considering strongly   Meds ordered this encounter  Medications  . methylPREDNISolone acetate (DEPO-MEDROL) injection 80 mg  . albuterol (PROVENTIL HFA;VENTOLIN HFA) 108 (90 Base) MCG/ACT inhaler    Sig: Inhale 2 puffs into the lungs every 6 (six) hours as needed for wheezing or shortness of breath.    Dispense:  1 Inhaler    Refill:  0  . amoxicillin-clavulanate (AUGMENTIN) 875-125 MG tablet    Sig: Take 1 tablet by mouth 2 (two) times daily.    Dispense:  20 tablet    Refill:  0    Murtis SinkSam Bradshaw, MD [facility] 05/26/2017, 8:44 AM

## 2017-05-26 NOTE — Patient Instructions (Signed)
Great to meet you!   Sinusitis, Adult Sinusitis is soreness and inflammation of your sinuses. Sinuses are hollow spaces in the bones around your face. They are located:  Around your eyes.  In the middle of your forehead.  Behind your nose.  In your cheekbones.  Your sinuses and nasal passages are lined with a stringy fluid (mucus). Mucus normally drains out of your sinuses. When your nasal tissues get inflamed or swollen, the mucus can get trapped or blocked so air cannot flow through your sinuses. This lets bacteria, viruses, and funguses grow, and that leads to infection. Follow these instructions at home: Medicines  Take, use, or apply over-the-counter and prescription medicines only as told by your doctor. These may include nasal sprays.  If you were prescribed an antibiotic medicine, take it as told by your doctor. Do not stop taking the antibiotic even if you start to feel better. Hydrate and Humidify  Drink enough water to keep your pee (urine) clear or pale yellow.  Use a cool mist humidifier to keep the humidity level in your home above 50%.  Breathe in steam for 10-15 minutes, 3-4 times a day or as told by your doctor. You can do this in the bathroom while a hot shower is running.  Try not to spend time in cool or dry air. Rest  Rest as much as possible.  Sleep with your head raised (elevated).  Make sure to get enough sleep each night. General instructions  Put a warm, moist washcloth on your face 3-4 times a day or as told by your doctor. This will help with discomfort.  Wash your hands often with soap and water. If there is no soap and water, use hand sanitizer.  Do not smoke. Avoid being around people who are smoking (secondhand smoke).  Keep all follow-up visits as told by your doctor. This is important. Contact a doctor if:  You have a fever.  Your symptoms get worse.  Your symptoms do not get better within 10 days. Get help right away if:  You  have a very bad headache.  You cannot stop throwing up (vomiting).  You have pain or swelling around your face or eyes.  You have trouble seeing.  You feel confused.  Your neck is stiff.  You have trouble breathing. This information is not intended to replace advice given to you by your health care provider. Make sure you discuss any questions you have with your health care provider. Document Released: 09/27/2007 Document Revised: 12/05/2015 Document Reviewed: 02/03/2015 Elsevier Interactive Patient Education  2018 [employer]  

## 2017-06-13 ENCOUNTER — Other Ambulatory Visit: Payer: Self-pay | Admitting: Family Medicine

## 2018-02-23 IMAGING — CR DG LUMBAR SPINE COMPLETE 4+V
5 series · 5 of 5 positions shown · non-contrast
Comparison: None.

CLINICAL DATA: Fell 6 feet out of truck, landing on back today.
Severe lower back pain.

EXAM:
LUMBAR SPINE - COMPLETE 4+ VIEW

[l-spine ap]
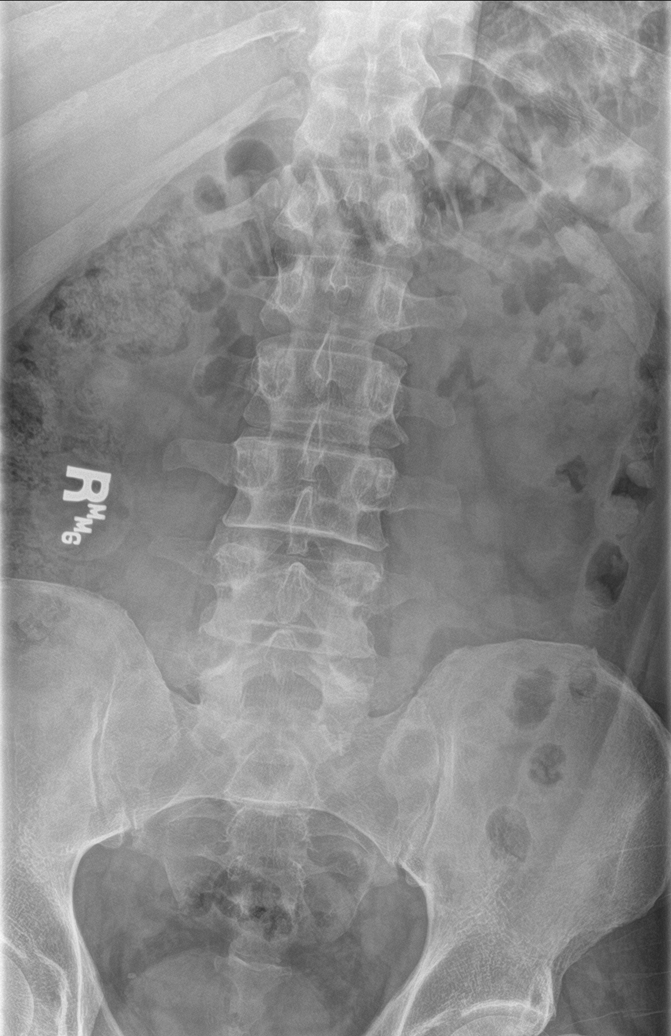

[l-spine obl (1 of 2)]
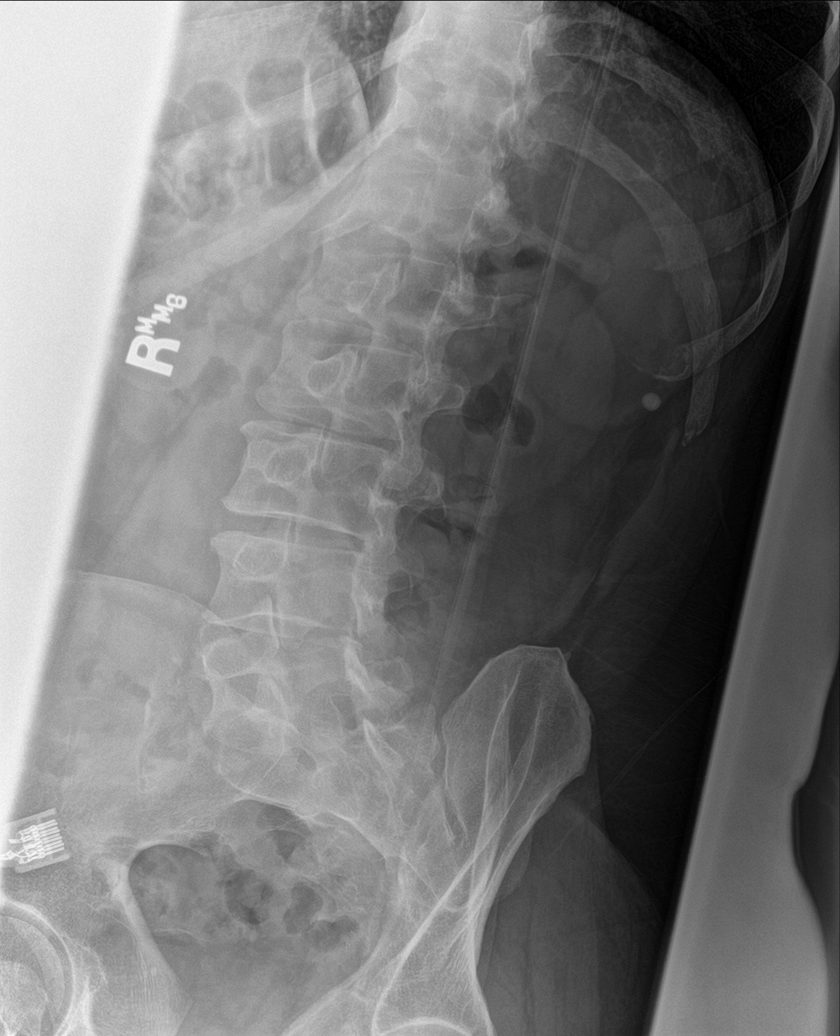

[l-spine obl (2 of 2)]
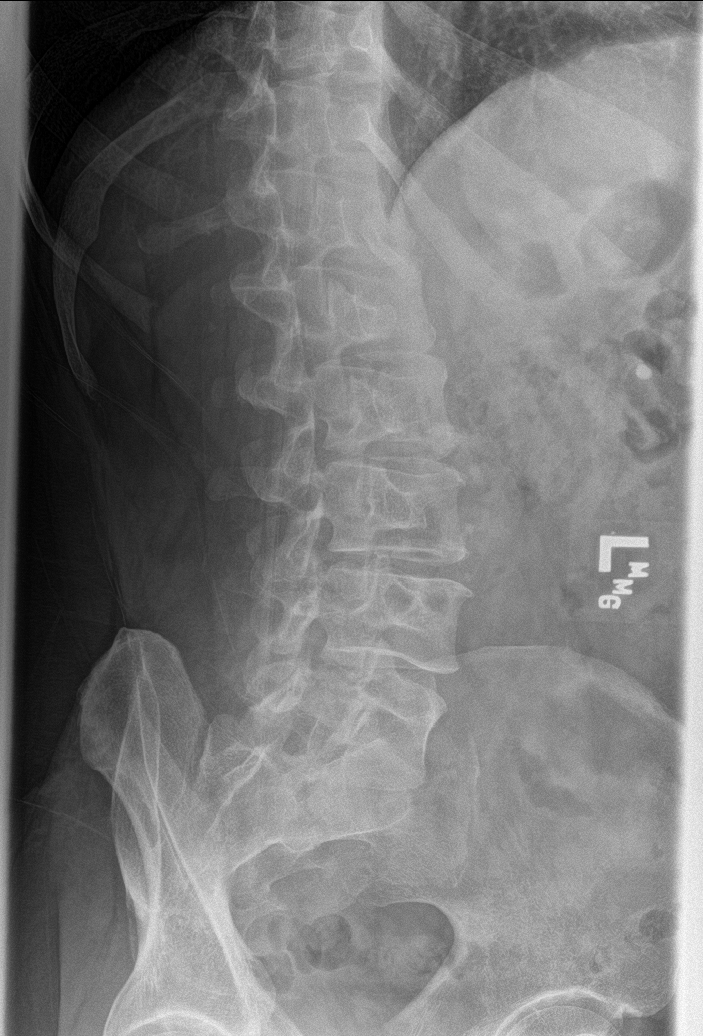

[l-spine lat]
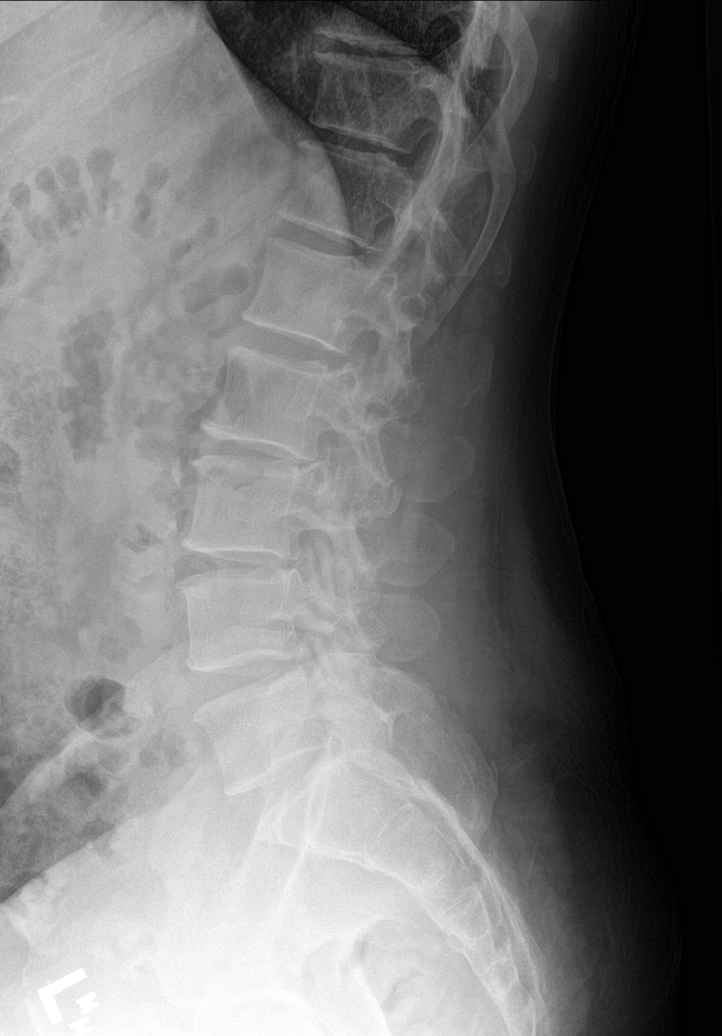

[l-spine spot]
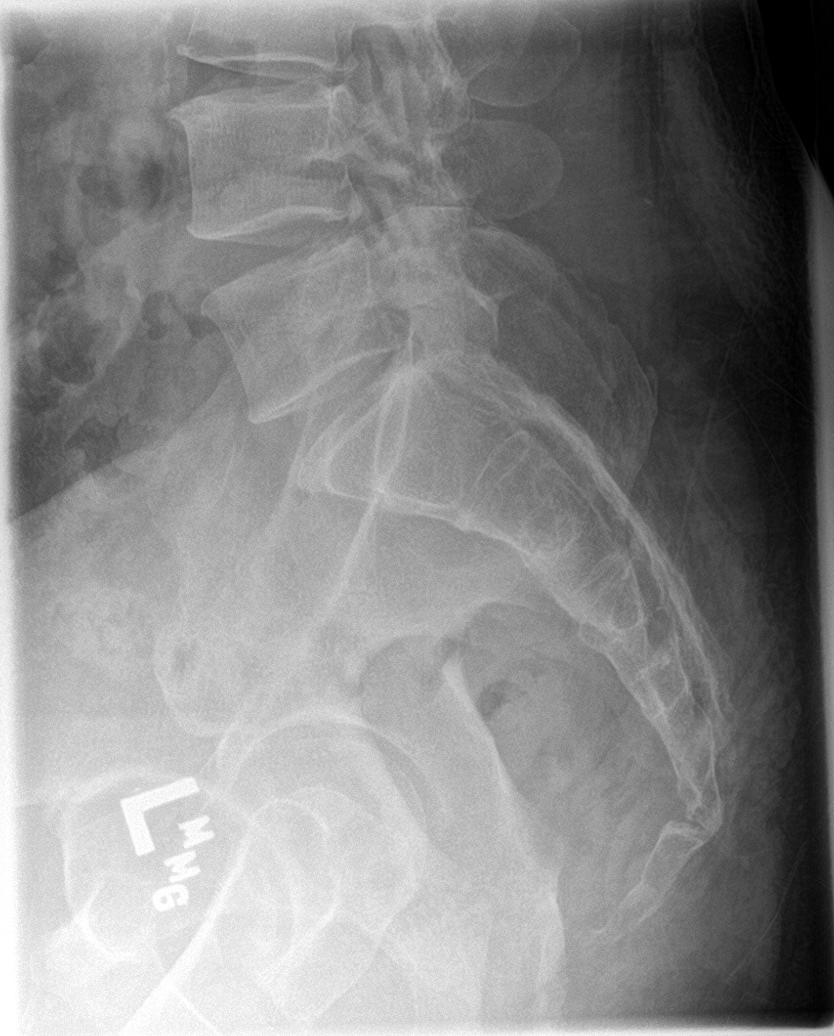

[5 of 5 positions shown; findings below may reference images not displayed]

FINDINGS: Five non rib-bearing lumbar-type vertebral bodies are intact and
aligned with maintenance of the lumbar lordosis. Intervertebral mild
L2-3 disc height loss with endplate spurring. Mild endplate spurring
L3-4, L4-5. No destructive bony lesions.

Sacroiliac joints are symmetric. Included prevertebral and
paraspinal soft tissue planes are non-suspicious. Subcentimeter
calcification projecting LEFT flank.
IMPRESSION: Mild degenerative changes lumbar spine. No acute fracture deformity
or malalignment.

## 2020-01-08 ENCOUNTER — Other Ambulatory Visit: Payer: Self-pay

## 2020-01-08 DIAGNOSIS — Z20822 Contact with and (suspected) exposure to covid-19: Secondary | ICD-10-CM

## 2020-01-10 LAB — NOVEL CORONAVIRUS, NAA: SARS-CoV-2, NAA: DETECTED — AB

## 2020-01-10 LAB — SARS-COV-2, NAA 2 DAY TAT

## 2020-01-14 ENCOUNTER — Encounter: Payer: Self-pay | Admitting: Nurse Practitioner

## 2020-01-16 ENCOUNTER — Encounter: Payer: Self-pay | Admitting: Nurse Practitioner

## 2020-01-19 ENCOUNTER — Encounter: Payer: Self-pay | Admitting: Nurse Practitioner

## 2020-01-19 ENCOUNTER — Ambulatory Visit (INDEPENDENT_AMBULATORY_CARE_PROVIDER_SITE_OTHER): Payer: No Typology Code available for payment source | Admitting: Nurse Practitioner

## 2020-01-19 DIAGNOSIS — U071 COVID-19: Secondary | ICD-10-CM | POA: Diagnosis not present

## 2020-01-19 DIAGNOSIS — R0602 Shortness of breath: Secondary | ICD-10-CM

## 2020-01-19 DIAGNOSIS — R5383 Other fatigue: Secondary | ICD-10-CM

## 2020-01-19 NOTE — Progress Notes (Signed)
   Virtual Visit via telephone Note Due to COVID-19 pandemic this visit was conducted virtually. This visit type was conducted due to national recommendations for restrictions regarding the COVID-19 Pandemic (e.g. social distancing, sheltering in place) in an effort to limit this patient's exposure and mitigate transmission in our community. All issues noted in this document were discussed and addressed.  A physical exam was not performed with this format.  I connected with Eyvonne Benitez on 01/19/20 at 12:40 by telephone and verified that I am speaking with the correct person using two identifiers. Manuel Benitez is currently located at home  and no one is currently with him during visit. The provider, Mary-Margaret Daphine Deutscher, FNP is located in their office at time of visit.  I discussed the limitations, risks, security and privacy concerns of performing an evaluation and management service by telephone and the availability of in person appointments. I also discussed with the patient that there may be a patient responsible charge related to this service. The patient expressed understanding and agreed to proceed.   History and Present Illness:   Chief Complaint: Covid Positive   HPI Patient tested positive for covid on 01/08/20. He has been quarantine since tested positive. He is much better. He is still c/o tingling in joints and fatigue. He says he feels a little SOB.  He no longer has cough or congestion. He took steroids and used HFA.  he drives a truck for a living and he does not feel that he can stay safe right now.   Review of Systems  Constitutional: Negative for chills and fever.  HENT: Positive for congestion. Negative for ear discharge and sore throat.   Respiratory: Positive for shortness of breath. Negative for cough and sputum production.   Musculoskeletal: Negative.   Neurological: Positive for dizziness. Negative for headaches.  Psychiatric/Behavioral: Negative.   All other  systems reviewed and are negative.    Observations/Objective: Alert and oriented- answers all questions appropriately No distress Sounds fatigued   Assessment and Plan: [name] in today with chief complaint of Covid Positive   1. Lab test positive for detection of COVID-19 virus  2. Other fatigue  3. SOB (shortness of breath) Continue to rest' Force fluids Albuterol HFA 2 puffs as nneded covid education o2 sat at home if possible.  Follow Up Instructions: Prn     I discussed the assessment and treatment plan with the patient. The patient was provided an opportunity to ask questions and all were answered. The patient agreed with the plan and demonstrated an understanding of the instructions.   The patient was advised to call back or seek an in-person evaluation if the symptoms worsen or if the condition fails to improve as anticipated.  The above assessment and management plan was discussed with the patient. The patient verbalized understanding of and has agreed to the management plan. Patient is aware to call the clinic if symptoms persist or worsen. Patient is aware when to return to the clinic for a follow-up visit. Patient educated on when it is appropriate to go to the emergency department.   Time call ended:  1:00  I provided 20 minutes of non-face-to-face time during this encounter.    Mary-Margaret Daphine Deutscher, FNP

## 2020-10-15 ENCOUNTER — Ambulatory Visit: Payer: Self-pay | Admitting: Family Medicine
